# Patient Record
Sex: Male | Born: 1990 | Race: White | Hispanic: No | Marital: Single | Smoking: Current every day smoker
Health system: Southern US, Community
[De-identification: ages and names within clinical notes are randomized; demographics above are authoritative.]

## PROBLEM LIST (undated history)

## (undated) DIAGNOSIS — F1910 Other psychoactive substance abuse, uncomplicated: Secondary | ICD-10-CM

## (undated) DIAGNOSIS — F32A Depression, unspecified: Secondary | ICD-10-CM

## (undated) DIAGNOSIS — F329 Major depressive disorder, single episode, unspecified: Secondary | ICD-10-CM

## (undated) HISTORY — DX: Other psychoactive substance abuse, uncomplicated: F19.10

## (undated) HISTORY — DX: Depression, unspecified: F32.A

## (undated) HISTORY — DX: Major depressive disorder, single episode, unspecified: F32.9

## (undated) HISTORY — PX: OTHER SURGICAL HISTORY: SHX169

---
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
## 2013-04-20 IMAGING — CR DG HIP (WITH OR WITHOUT PELVIS) 2-3V*L*
3 series · 3 of 3 positions shown · non-contrast
Comparison: None.

CLINICAL DATA: Left hip pain.  No known injury.  Previous
dislocation.

LEFT HIP - COMPLETE 2+ VIEW

[t pelvis ap]
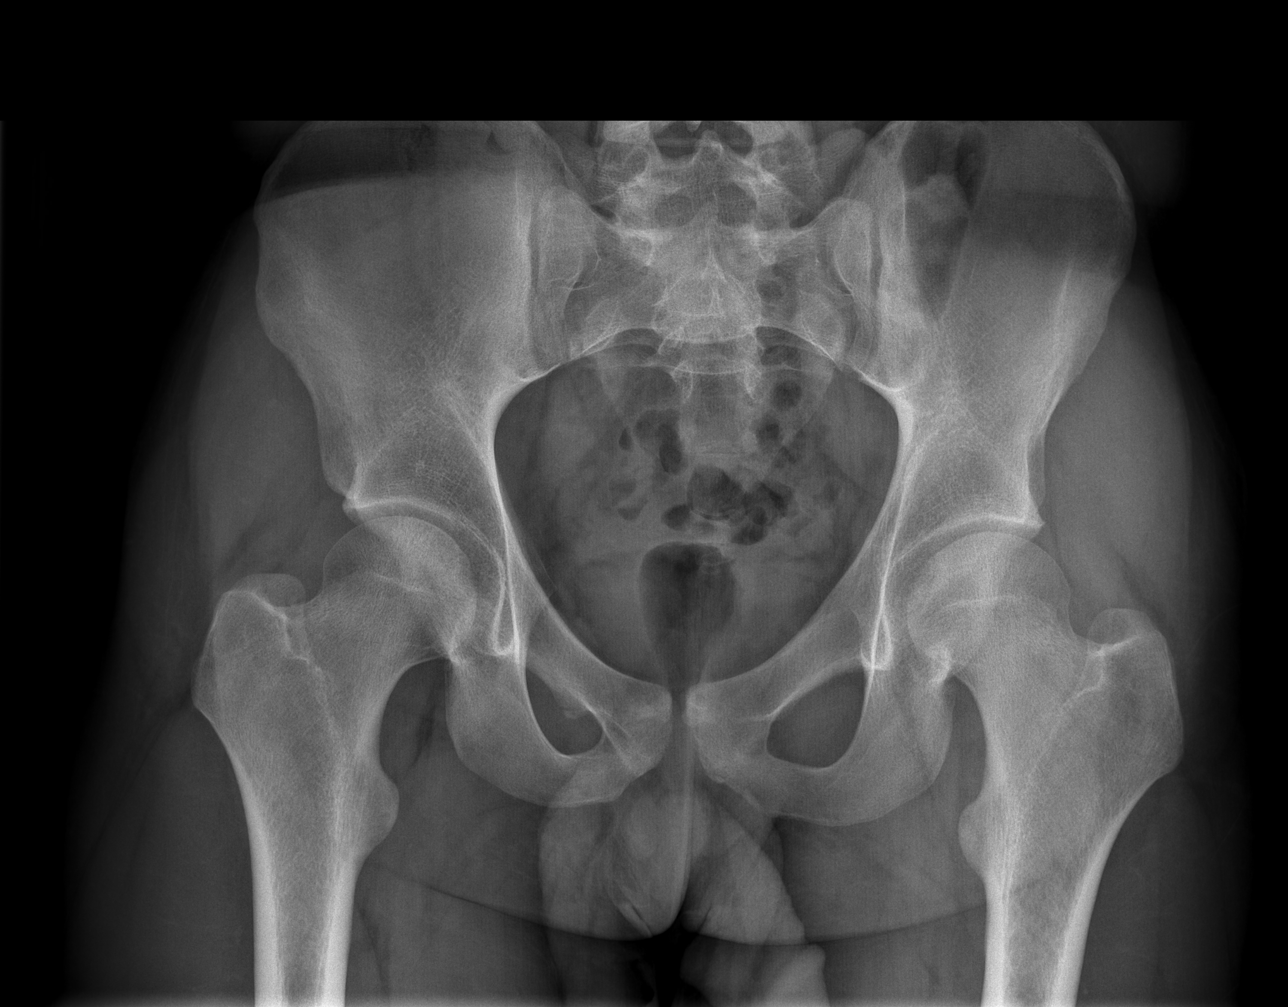

[t hip ap left]
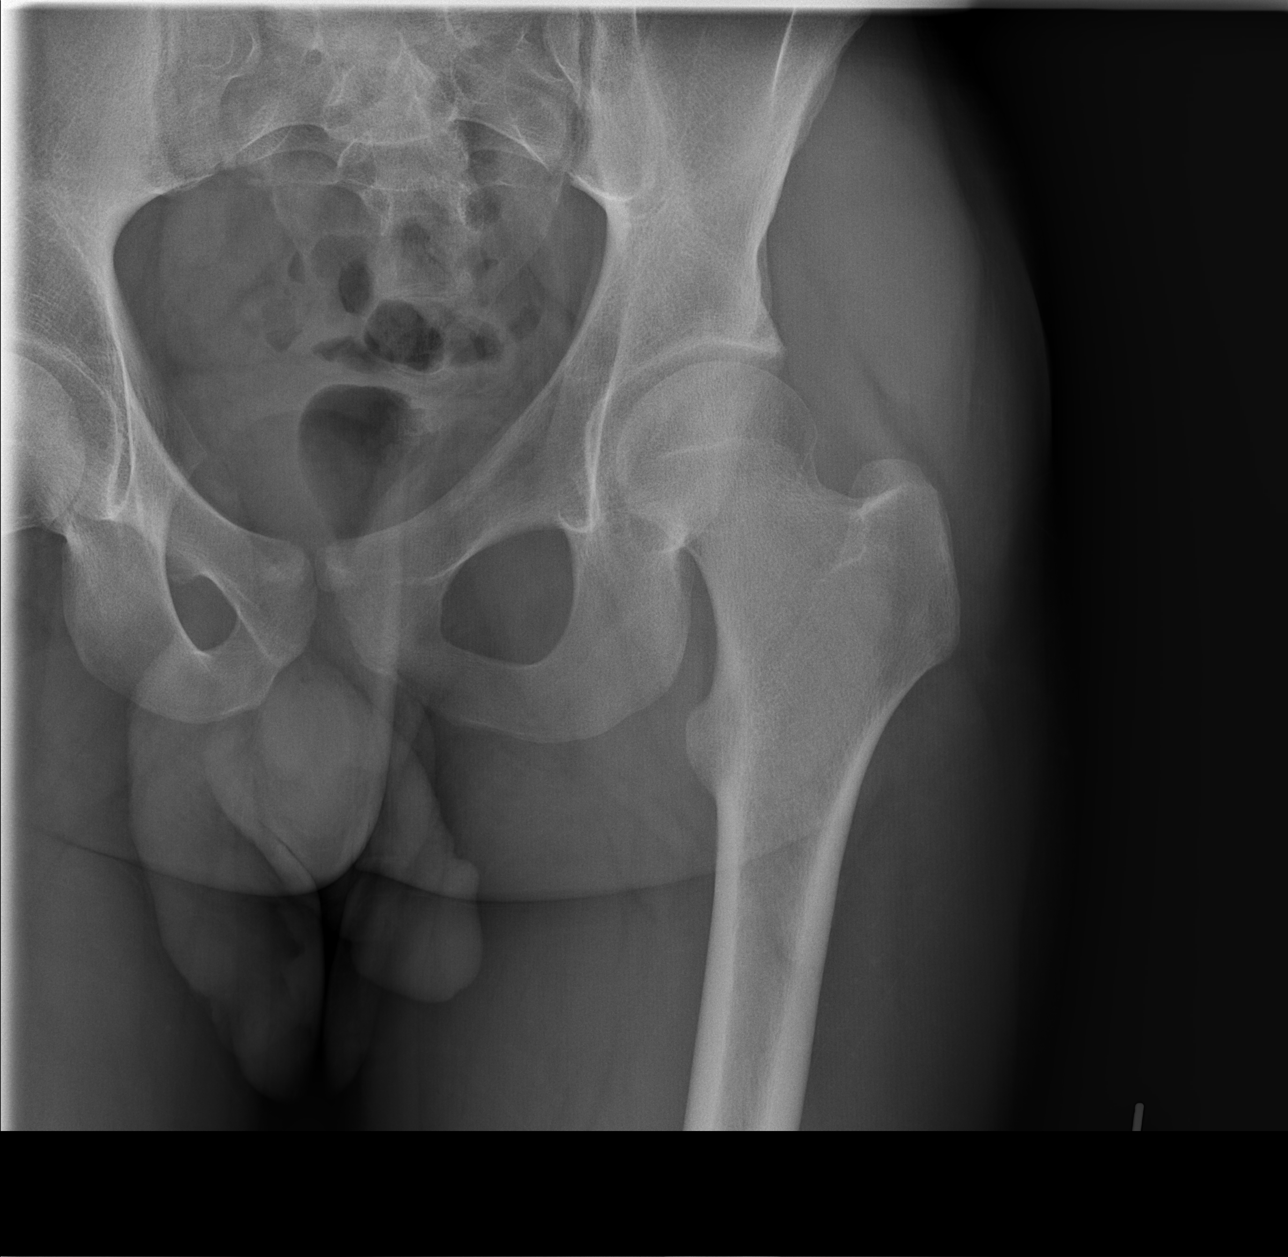

[t hip frog leg left]
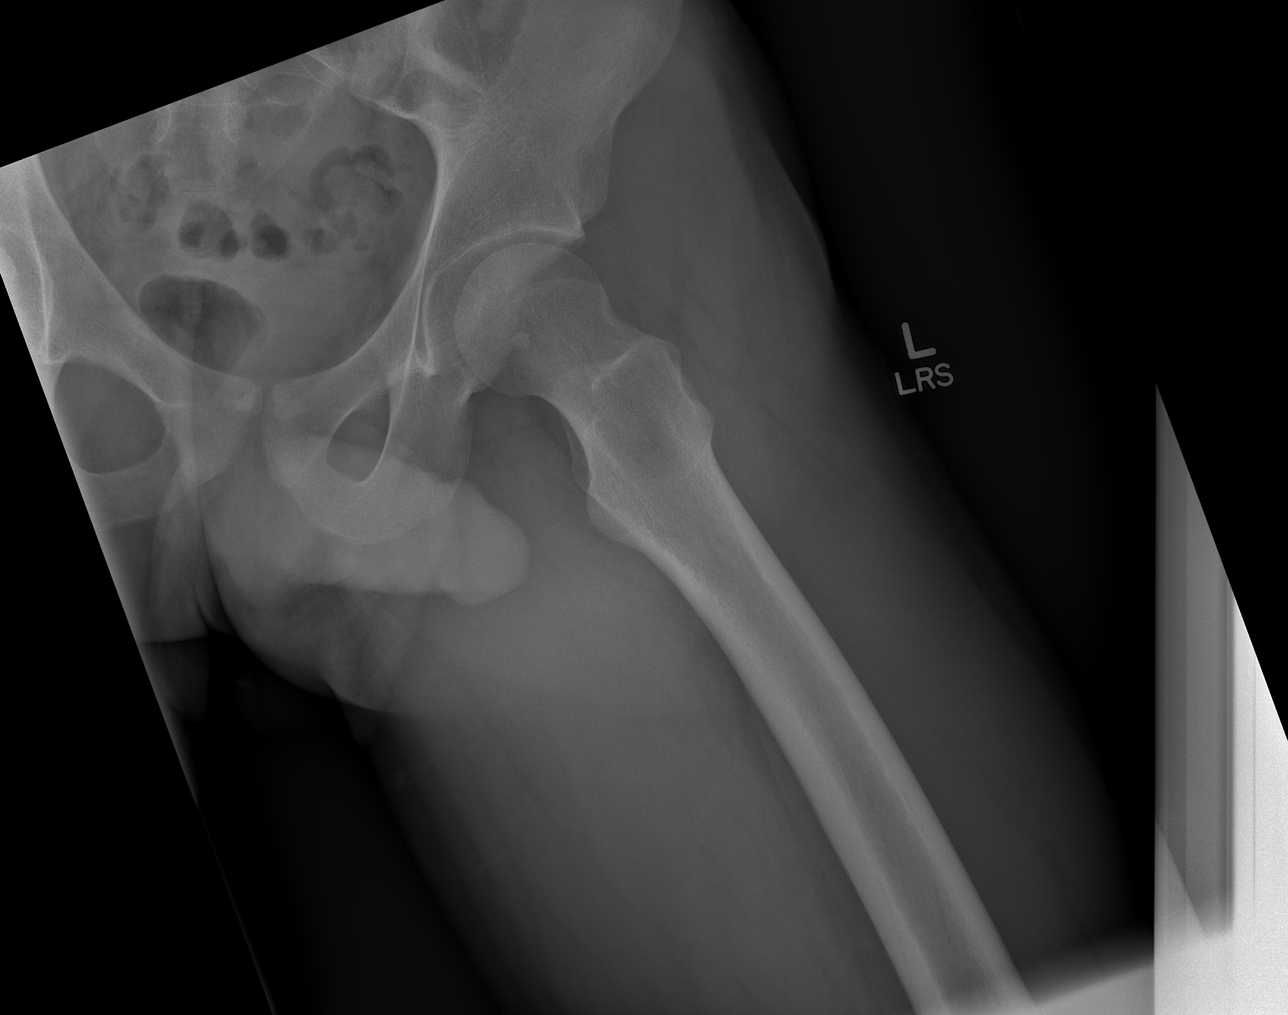

[3 of 3 positions shown; findings below may reference images not displayed]

FINDINGS: The pelvis, SI joints, and symphysis pubis appear intact.
Visualized right hip appears intact.

Left hip demonstrates no evidence of acute fracture or subluxation.
No focal bone lesion or bone destruction.  Bone cortex and
trabecular architecture appear intact.  No abnormal periosteal
reaction.
IMPRESSION: No acute bony abnormalities.

## 2017-05-17 ENCOUNTER — Encounter: Payer: Self-pay | Admitting: *Deleted

## 2017-05-17 ENCOUNTER — Emergency Department
Admission: EM | Admit: 2017-05-17 | Discharge: 2017-05-17 | Disposition: A | Discharge status: Home / Self Care | Payer: Self-pay | Source: Home / Self Care | Attending: Family Medicine | Admitting: Family Medicine

## 2017-05-17 DIAGNOSIS — L237 Allergic contact dermatitis due to plants, except food: Secondary | ICD-10-CM

## 2017-05-17 MED ORDER — METHYLPREDNISOLONE SODIUM SUCC 40 MG IJ SOLR
80.0000 mg | Freq: Once | INTRAMUSCULAR | Status: AC
  Administered 2017-05-17: 80 mg via INTRAMUSCULAR

## 2017-05-17 MED ORDER — HYDROXYZINE HCL 25 MG PO TABS: 25.0000 mg | ORAL_TABLET | Freq: Four times a day (QID) | ORAL | 0 refills | Status: DC | PRN

## 2017-05-17 MED ORDER — PREDNISONE 20 MG PO TABS: ORAL_TABLET | ORAL | 0 refills | Status: DC

## 2017-05-17 NOTE — ED Provider Notes (Signed)
[facility]    CSN: [id number] Arrival date & time: 05/17/17  1757     History   Chief Complaint Chief Complaint  Patient presents with  . Rash    HPI Ernest Ortiz is a 26 y.o. male.   HPI  Ernest Ortiz is a 26 y.o. male presenting to UC with c/o diffuse erythematous pruritic rash that started 2 days ago after he came in contact with likely poison ivy. He has had a similar rash in the past. He notes he is planning to elope in 2 weeks and hopes to get rid of the rash as he does have it on his face as well.  Denies oral swelling or difficulty breathing. No new soaps, lotions or medications. He has tried calamine lotion w/o relief.    Past Medical History:  Diagnosis Date  . Depression   . Drug abuse (HCC)     There are no active problems to display for this patient.   Past Surgical History:  Procedure Laterality Date  . Broken foot    . knee laseration         Home Medications    Prior to Admission medications   Medication Sig Start Date End Date Taking? Authorizing Provider  hydrOXYzine (ATARAX/VISTARIL) 25 MG tablet Take 1 tablet (25 mg total) by mouth every 6 (six) hours as needed. 05/17/17   Lurene Shadow, PA-C  predniSONE (DELTASONE) 20 MG tablet 3 tabs po day one, then 2 po daily x 4 days 05/17/17   Lurene Shadow, PA-C    Family History History reviewed. No pertinent family history.  Social History Social History  Substance Use Topics  . Smoking status: Current Every Day Smoker    Packs/day: 1.00  . Smokeless tobacco: Never Used  . Alcohol use 0.6 oz/week    1 Cans of beer per week     Allergies   Vantin [cefpodoxime proxetil]   Review of Systems Review of Systems  Constitutional: Negative for chills and fever.  HENT: Positive for facial swelling (mild). Negative for sore throat.   Respiratory: Negative for chest tightness, shortness of breath and wheezing.   Cardiovascular: Negative for chest pain and palpitations.    Gastrointestinal: Negative for diarrhea, nausea and vomiting.  Musculoskeletal: Negative for arthralgias, joint swelling and myalgias.  Skin: Positive for color change and rash. Negative for wound.     Physical Exam Triage Vital Signs ED Triage Vitals  Enc Vitals Group     BP 05/17/17 1820 (!) 144/89     Pulse Rate 05/17/17 1820 81     Resp 05/17/17 1820 16     Temp 05/17/17 1820 97.6 F (36.4 C)     Temp Source 05/17/17 1820 Oral     SpO2 05/17/17 1820 99 %     Weight 05/17/17 1821 196 lb (88.9 kg)     Height 05/17/17 1821  (1.778 m)     Head Circumference --      Peak Flow --      Pain Score 05/17/17 1821 0     Pain Loc --      Pain Edu? --      Excl. in GC? --    No data found.   Updated Vital Signs BP (!) 144/89 (BP Location: Left Arm)   Pulse 81   Temp 97.6 F (36.4 C) (Oral)   Resp 16   Ht  (1.778 m)   Wt 196 lb (88.9 kg)   SpO2  99%   BMI 28.12 kg/m   Visual Acuity Right Eye Distance:   Left Eye Distance:   Bilateral Distance:    Right Eye Near:   Left Eye Near:    Bilateral Near:     Physical Exam  Constitutional: He is oriented to person, place, and time. He appears well-developed and well-nourished. No distress.  HENT:  Head: Normocephalic and atraumatic.  Mouth/Throat: Oropharynx is clear and moist.  Eyes: EOM are normal.  Neck: Normal range of motion. Neck supple.  Cardiovascular: Normal rate and regular rhythm.   Pulmonary/Chest: Effort normal and breath sounds normal. No respiratory distress. He has no wheezes. He has no rales.  Musculoskeletal: Normal range of motion.  Neurological: He is alert and oriented to person, place, and time.  Skin: Skin is warm and dry. Rash noted. He is not diaphoretic. There is erythema.  Diffuse erythematous maculopapular rash to face, bilateral arms and abdomen. Rash is non-tender. Does blanch.   Psychiatric: He has a normal mood and affect. His behavior is normal.  Nursing note and vitals  reviewed.    UC Treatments / Results  Labs (all labs ordered are listed, but only abnormal results are displayed) Labs Reviewed - No data to display  EKG  EKG Interpretation None       Radiology No results found.  Procedures Procedures (including critical care time)  Medications Ordered in UC Medications  methylPREDNISolone sodium succinate (SOLU-MEDROL) 40 mg/mL injection 80 mg (80 mg Intramuscular Given 05/17/17 1848)     Initial Impression / Assessment and Plan / UC Course  I have reviewed the triage vital signs and the nursing notes.  Pertinent labs & imaging results that were available during my care of the patient were reviewed by me and considered in my medical decision making (see chart for details).     Rash c/w contact dermatitis.  Solu-medrol  IM given Take medications as prescribed  May take OTC non-drowsy antihistamine such as Claritin or Zyrtec during the day.  F/u with PCP in 1 week if needed.   Final Clinical Impressions(s) / UC Diagnoses   Final diagnoses:  Allergic contact dermatitis due to plants, except food    New Prescriptions Discharge Medication List as of 05/17/2017  6:42 PM    START taking these medications   Details  hydrOXYzine (ATARAX/VISTARIL) 25 MG tablet Take 1 tablet (25 mg total) by mouth every 6 (six) hours as needed., Starting Tue 05/17/2017, Normal    predniSONE (DELTASONE) 20 MG tablet 3 tabs po day one, then 2 po daily x 4 days, Normal         Controlled Substance Prescriptions [place] Controlled Substance Registry consulted? Not Applicable   Rolla Plate 05/20/17 4696

## 2017-05-17 NOTE — Discharge Instructions (Signed)
°  You were given a shot of solumedrol (a steroid) today to help with itching and swelling from a likely allergic reaction.  You have been prescribed 5 days of prednisone, an oral steroid.  You may start this medication tomorrow with breakfast.    Atarax (hydroxizine) is an antihistamine that can be taken to help with itching. This medication can cause drowsiness so do not drive or drink alcohol while taking.

## 2017-05-17 NOTE — ED Triage Notes (Signed)
Pt c/o rash all over x 2 days.

## 2017-11-25 ENCOUNTER — Other Ambulatory Visit: Payer: Self-pay

## 2017-11-25 ENCOUNTER — Emergency Department (INDEPENDENT_AMBULATORY_CARE_PROVIDER_SITE_OTHER)
Admission: EM | Admit: 2017-11-25 | Discharge: 2017-11-25 | Disposition: A | Discharge status: Home / Self Care | Payer: 59 | Source: Home / Self Care | Attending: Family Medicine | Admitting: Family Medicine

## 2017-11-25 DIAGNOSIS — J069 Acute upper respiratory infection, unspecified: Secondary | ICD-10-CM | POA: Diagnosis not present

## 2017-11-25 DIAGNOSIS — B9789 Other viral agents as the cause of diseases classified elsewhere: Secondary | ICD-10-CM

## 2017-11-25 MED ORDER — AZITHROMYCIN 250 MG PO TABS: ORAL_TABLET | ORAL | 0 refills | Status: DC

## 2017-11-25 MED ORDER — BENZONATATE 200 MG PO CAPS: ORAL_CAPSULE | ORAL | 0 refills | Status: DC

## 2017-11-25 NOTE — Discharge Instructions (Addendum)
Take plain guaifenesin (1200mg extended release tabs such as Mucinex) twice daily, with plenty of water, for cough and congestion.  May add Pseudoephedrine (30mg, one or two every 4 to 6 hours) for sinus congestion.  Get adequate rest.   °May use Afrin nasal spray (or generic oxymetazoline) each morning for about 5 days and then discontinue.  Also recommend using saline nasal spray several times daily and saline nasal irrigation (AYR is a common brand).   °Try warm salt water gargles for sore throat.  °Stop all antihistamines for now, and other non-prescription cough/cold preparations. °May take Ibuprofen 200mg, 4 tabs every 8 hours with food for chest/sternum discomfort. °Begin Azithromycin if not improving about one week or if persistent fever develops   °  °

## 2017-11-25 NOTE — ED Triage Notes (Signed)
Pt presents here today with his fiance who both have been sick with productive coughs, with congestion and yellow/green sputum. Only has been taking zyrtec as needed.

## 2017-11-25 NOTE — ED Provider Notes (Signed)
[facility]    CSN: [id number] Arrival date & time: 11/25/17  [id number]     History   Chief Complaint Chief Complaint  Patient presents with  . Cough    HPI Ernest Ortiz is a 27 y.o. male.   Patient complains of three day history of typical cold-like symptoms developing over several days, including mild sore throat, sinus congestion, headache, fatigue, and cough.   The history is provided by the patient.    Past Medical History:  Diagnosis Date  . Depression   . Drug abuse (HCC)     There are no active problems to display for this patient.   Past Surgical History:  Procedure Laterality Date  . Broken foot    . knee laseration         Home Medications    Prior to Admission medications   Medication Sig Start Date End Date Taking? Authorizing Provider  azithromycin (ZITHROMAX Z-PAK) 250 MG tablet Take 2 tabs today; then begin one tab once daily for 4 more days. (Rx void after 12/03/17) 11/25/17   Lattie HawBeese, Biviana Saddler A, MD  benzonatate (TESSALON) 200 MG capsule Take one cap by mouth at bedtime as needed for cough.  May repeat in 4 to 6 hours 11/25/17   Lattie HawBeese, Kydan Shanholtzer A, MD  hydrOXYzine (ATARAX/VISTARIL) 25 MG tablet Take 1 tablet (25 mg total) by mouth every 6 (six) hours as needed. 05/17/17   Lurene ShadowPhelps, Erin O, PA-C    Family History History reviewed. No pertinent family history.  Social History Social History   Tobacco Use  . Smoking status: Current Every Day Smoker    Packs/day: 1.00  . Smokeless tobacco: Never Used  Substance Use Topics  . Alcohol use: Yes    Alcohol/week: 0.6 oz    Types: 1 Cans of beer per week  . Drug use: Yes    Types: Marijuana     Allergies   Vantin [cefpodoxime proxetil]   Review of Systems Review of Systems + sore throat + cough No pleuritic pain No wheezing + nasal congestion + post-nasal drainage No sinus pain/pressure No itchy/red eyes No earache No hemoptysis No SOB No fever, + chills/sweats No  nausea No vomiting No abdominal pain No diarrhea No urinary symptoms No skin rash + fatigue + myalgias No headache Used OTC meds without relief   Physical Exam Triage Vital Signs ED Triage Vitals  Enc Vitals Group     BP 11/25/17 0902 (!) 138/94     Pulse Rate 11/25/17 0902 94     Resp 11/25/17 0902 18     Temp 11/25/17 0902 98.9 F (37.2 C)     Temp Source 11/25/17 0902 Oral     SpO2 11/25/17 0902 98 %     Weight 11/25/17 0903 197 lb (89.4 kg)     Height 11/25/17 0903 5\' 10"  (1.778 m)     Head Circumference --      Peak Flow --      Pain Score 11/25/17 0903 0     Pain Loc --      Pain Edu? --      Excl. in GC? --    No data found.  Updated Vital Signs BP (!) 138/94 (BP Location: Right Arm)   Pulse 94   Temp 98.9 F (37.2 C) (Oral)   Resp 18   Ht 5\' 10"  (1.778 m)   Wt 197 lb (89.4 kg)   SpO2 98%   BMI 28.27 kg/m   Visual Acuity  Right Eye Distance:   Left Eye Distance:   Bilateral Distance:    Right Eye Near:   Left Eye Near:    Bilateral Near:     Physical Exam Nursing notes and Vital Signs reviewed. Appearance:  Patient appears stated age, and in no acute distress Eyes:  Pupils are equal, round, and reactive to light and accomodation.  Extraocular movement is intact.  Conjunctivae are not inflamed  Ears:  Canals normal.  Tympanic membranes normal.  Nose:  Mildly congested turbinates.  No sinus tenderness.  Pharynx:  Normal Neck:  Supple.  Enlarged posterior/lateral nodes are palpated bilaterally, tender to palpation on the left.   Lungs:  Clear to auscultation.  Breath sounds are equal.  Moving air well. Heart:  Regular rate and rhythm without murmurs, rubs, or gallops.  Abdomen:  Nontender without masses or hepatosplenomegaly.  Bowel sounds are present.  No CVA or flank tenderness.  Extremities:  No edema.  Skin:  No rash present.    UC Treatments / Results  Labs (all labs ordered are listed, but only abnormal results are displayed) Labs  Reviewed - No data to display  EKG None Radiology No results found.  Procedures Procedures (including critical care time)  Medications Ordered in UC Medications - No data to display   Initial Impression / Assessment and Plan / UC Course  I have reviewed the triage vital signs and the nursing notes.  Pertinent labs & imaging results that were available during my care of the patient were reviewed by me and considered in my medical decision making (see chart for details).    There is no evidence of bacterial infection today.  Treat symptomatically for now  Prescription written for Benzonatate (Tessalon) to take at bedtime for night-time cough.  Take plain guaifenesin (1200mg  extended release tabs such as Mucinex) twice daily, with plenty of water, for cough and congestion.  May add Pseudoephedrine (30mg , one or two every 4 to 6 hours) for sinus congestion.  Get adequate rest.   May use Afrin nasal spray (or generic oxymetazoline) each morning for about 5 days and then discontinue.  Also recommend using saline nasal spray several times daily and saline nasal irrigation (AYR is a common brand).   Try warm salt water gargles for sore throat.  Stop all antihistamines for now, and other non-prescription cough/cold preparations. May take Ibuprofen 200mg , 4 tabs every 8 hours with food for chest/sternum discomfort. Begin Azithromycin if not improving about one week or if persistent fever develops (Given a prescription to hold, with an expiration date)  Followup with Family Doctor if not improved in about 10 days.    Final Clinical Impressions(s) / UC Diagnoses   Final diagnoses:  Viral URI with cough    ED Discharge Orders        Ordered    benzonatate (TESSALON) 200 MG capsule     11/25/17 0951    azithromycin (ZITHROMAX Z-PAK) 250 MG tablet     11/25/17 4098         Lattie Haw, MD 11/27/17 1655

## 2018-04-18 ENCOUNTER — Other Ambulatory Visit: Payer: Self-pay

## 2018-04-18 ENCOUNTER — Emergency Department (INDEPENDENT_AMBULATORY_CARE_PROVIDER_SITE_OTHER)
Admission: EM | Admit: 2018-04-18 | Discharge: 2018-04-18 | Disposition: A | Discharge status: Home / Self Care | Payer: 59 | Source: Home / Self Care | Attending: Family Medicine | Admitting: Family Medicine

## 2018-04-18 DIAGNOSIS — R05 Cough: Secondary | ICD-10-CM | POA: Diagnosis not present

## 2018-04-18 DIAGNOSIS — B9689 Other specified bacterial agents as the cause of diseases classified elsewhere: Secondary | ICD-10-CM

## 2018-04-18 DIAGNOSIS — R21 Rash and other nonspecific skin eruption: Secondary | ICD-10-CM

## 2018-04-18 DIAGNOSIS — R053 Chronic cough: Secondary | ICD-10-CM

## 2018-04-18 DIAGNOSIS — J019 Acute sinusitis, unspecified: Secondary | ICD-10-CM

## 2018-04-18 MED ORDER — AMOXICILLIN-POT CLAVULANATE 875-125 MG PO TABS: 1.0000 | ORAL_TABLET | Freq: Two times a day (BID) | ORAL | 0 refills | Status: DC

## 2018-04-18 MED ORDER — GUAIFENESIN-CODEINE 100-10 MG/5ML PO SOLN: 5.0000 mL | Freq: Three times a day (TID) | ORAL | 0 refills | Status: DC | PRN

## 2018-04-18 MED ORDER — WITCH HAZEL-GLYCERIN EX PADS: 1.0000 "application " | MEDICATED_PAD | CUTANEOUS | 1 refills | Status: DC | PRN

## 2018-04-18 MED ORDER — ZINC OXIDE 40 % EX OINT: 1.0000 "application " | TOPICAL_OINTMENT | CUTANEOUS | 0 refills | Status: DC | PRN

## 2018-04-18 NOTE — ED Provider Notes (Signed)
[facility]    CSN: [id number] Arrival date & time: 04/18/18  4098     History   Chief Complaint Chief Complaint  Patient presents with  . Rash  . Cough    HPI Roczen Waymire is a 27 y.o. male.   HPI  Henrik Orihuela is a 27 y.o. male presenting to UC with c/o intermittently productive cough for about 1 month, worse over the last 1 week with thick white to green phlegm.  He is SOB at times.  He has tried OTC cough medication w/o relief. No known sick contacts or recent travel. Denies fever, chills, n/v/d. Pt also c/o a painful blistering peeling rash on his penis that started about 3 days ago.  He has not tried any OTC medications. Pt is married in a monogamous relationship. Denies concern for STIs. Denies new soaps, lotions, medications or detergents.     Past Medical History:  Diagnosis Date  . Depression   . Drug abuse (HCC)     There are no active problems to display for this patient.   Past Surgical History:  Procedure Laterality Date  . Broken foot    . knee laseration         Home Medications    Prior to Admission medications   Medication Sig Start Date End Date Taking? Authorizing Provider  amoxicillin-clavulanate (AUGMENTIN) 875-125 MG tablet Take 1 tablet by mouth 2 (two) times daily. One po bid x 7 days 04/18/18   Lurene Shadow, PA-C  azithromycin (ZITHROMAX Z-PAK) 250 MG tablet Take 2 tabs today; then begin one tab once daily for 4 more days. (Rx void after 12/03/17) 11/25/17   Lattie Haw, MD  benzonatate (TESSALON) 200 MG capsule Take one cap by mouth at bedtime as needed for cough.  May repeat in 4 to 6 hours 11/25/17   Lattie Haw, MD  guaiFENesin-codeine 100-10 MG/5ML syrup Take 5-10 mLs by mouth 3 (three) times daily as needed for cough. 04/18/18   Lurene Shadow, PA-C  hydrOXYzine (ATARAX/VISTARIL) 25 MG tablet Take 1 tablet (25 mg total) by mouth every 6 (six) hours as needed. 05/17/17   Lurene Shadow, PA-C  liver oil-zinc  oxide (DESITIN) 40 % ointment Apply 1 application topically as needed for irritation. 04/18/18   Lurene Shadow, PA-C  witch hazel-glycerin (TUCKS) pad Apply 1 application topically as needed for irritation. 04/18/18   Lurene Shadow, PA-C    Family History History reviewed. No pertinent family history.  Social History Social History   Tobacco Use  . Smoking status: Current Every Day Smoker    Packs/day: 1.00  . Smokeless tobacco: Never Used  Substance Use Topics  . Alcohol use: Yes    Alcohol/week: 1.0 standard drinks    Types: 1 Cans of beer per week  . Drug use: Yes    Types: Marijuana     Allergies   Vantin [cefpodoxime proxetil]   Review of Systems Review of Systems  Constitutional: Negative for chills and fever.  HENT: Positive for congestion, postnasal drip and rhinorrhea. Negative for ear pain, sore throat, trouble swallowing and voice change.   Respiratory: Positive for cough. Negative for shortness of breath.   Cardiovascular: Negative for chest pain and palpitations.  Gastrointestinal: Negative for abdominal pain, diarrhea, nausea and vomiting.  Genitourinary: Positive for discharge (from rash), genital sores and penile pain. Negative for dysuria, hematuria, penile swelling, scrotal swelling, testicular pain and urgency.  Musculoskeletal: Negative for arthralgias, back pain  and myalgias.  Skin: Negative for rash.     Physical Exam Triage Vital Signs ED Triage Vitals  Enc Vitals Group     BP 04/18/18 0959 131/88     Pulse Rate 04/18/18 0959 80     Resp --      Temp 04/18/18 0959 98 F (36.7 C)     Temp Source 04/18/18 0959 Oral     SpO2 04/18/18 0959 98 %     Weight 04/18/18 1000 209 lb (94.8 kg)     Height 04/18/18 1000 5\' 10"  (1.778 m)     Head Circumference --      Peak Flow --      Pain Score 04/18/18 1000 3     Pain Loc --      Pain Edu? --      Excl. in GC? --    No data found.  Updated Vital Signs BP 131/88 (BP Location: Right Arm)    Pulse 80   Temp 98 F (36.7 C) (Oral)   Ht 5\' 10"  (1.778 m)   Wt 209 lb (94.8 kg)   SpO2 98%   BMI 29.99 kg/m   Visual Acuity Right Eye Distance:   Left Eye Distance:   Bilateral Distance:    Right Eye Near:   Left Eye Near:    Bilateral Near:     Physical Exam  Constitutional: He is oriented to person, place, and time. He appears well-developed and well-nourished. No distress.  HENT:  Head: Normocephalic and atraumatic.  Right Ear: Tympanic membrane normal.  Left Ear: Tympanic membrane normal.  Nose: Mucosal edema present. Right sinus exhibits maxillary sinus tenderness. Right sinus exhibits no frontal sinus tenderness. Left sinus exhibits maxillary sinus tenderness. Left sinus exhibits no frontal sinus tenderness.  Mouth/Throat: Uvula is midline, oropharynx is clear and moist and mucous membranes are normal.  Eyes: EOM are normal.  Neck: Normal range of motion.  Cardiovascular: Normal rate and regular rhythm.  Pulmonary/Chest: Effort normal and breath sounds normal. No stridor. No respiratory distress. He has no wheezes. He has no rales.  Genitourinary: Testes normal. Circumcised. Penile tenderness present.     Genitourinary Comments: Penile rash: 1cm area of pealed blistered skin. Erythema, scant yellow discharge of rash/open sore.   Musculoskeletal: Normal range of motion.  Neurological: He is alert and oriented to person, place, and time.  Skin: Skin is warm and dry. He is not diaphoretic.  Psychiatric: He has a normal mood and affect. His behavior is normal.  Nursing note and vitals reviewed.    UC Treatments / Results  Labs (all labs ordered are listed, but only abnormal results are displayed) Labs Reviewed - No data to display  EKG None  Radiology No results found.  Procedures Procedures (including critical care time)  Medications Ordered in UC Medications - No data to display  Initial Impression / Assessment and Plan / UC Course  I have reviewed  the triage vital signs and the nursing notes.  Pertinent labs & imaging results that were available during my care of the patient were reviewed by me and considered in my medical decision making (see chart for details).     Hx and exam of URI symptoms c/w sinusitis. Will tx due to duration of symptoms. Offered CXR. Pt would like to try trial of antibiotics first, will f/u with CXR if cough not improving.  Rash on penis appears to be an infected skin sore. No small vesicles visualized c/w herpes.  No penile  discharge from urethra, only from the skin sore.  Pt declined STD testing.   Final Clinical Impressions(s) / UC Diagnoses   Final diagnoses:  Persistent cough for 3 weeks or longer  Acute bacterial rhinosinusitis  Penile rash     Discharge Instructions      Please follow up with a family medicine provider in 1 week if not improving. You may also try to follow up with urology if your rash is not improving.   Guaifenesin-codeine is a narcotic cough medication that can cause drowsiness. Do not drive while taking or take with other medications that could cause drowsiness as taking too much can cause difficulty breathing or even death.      ED Prescriptions    Medication Sig Dispense Auth. Provider   amoxicillin-clavulanate (AUGMENTIN) 875-125 MG tablet  (Status: Discontinued) Take 1 tablet by mouth 2 (two) times daily. One po bid x 7 days 14 tablet Doroteo Glassman, Chaunda Vandergriff O, PA-C   liver oil-zinc oxide (DESITIN) 40 % ointment  (Status: Discontinued) Apply 1 application topically as needed for irritation. 56.7 g Javion Holmer O, PA-C   guaiFENesin-codeine 100-10 MG/5ML syrup  (Status: Discontinued) Take 5-10 mLs by mouth 3 (three) times daily as needed for cough. 120 mL Lurene Shadow, PA-C   witch hazel-glycerin (TUCKS) pad  (Status: Discontinued) Apply 1 application topically as needed for irritation. 40 each Lurene Shadow, PA-C   amoxicillin-clavulanate (AUGMENTIN) 875-125 MG tablet Take 1  tablet by mouth 2 (two) times daily. One po bid x 7 days 14 tablet Bobi Daudelin O, PA-C   guaiFENesin-codeine 100-10 MG/5ML syrup Take 5-10 mLs by mouth 3 (three) times daily as needed for cough. 120 mL Waylan Rocher O, PA-C   liver oil-zinc oxide (DESITIN) 40 % ointment Apply 1 application topically as needed for irritation. 56.7 g Lurene Shadow, PA-C   witch hazel-glycerin (TUCKS) pad Apply 1 application topically as needed for irritation. 40 each Lurene Shadow, PA-C     Controlled Substance Prescriptions [place] Controlled Substance Registry consulted? Not Applicable   Rolla Plate 04/18/18 1324

## 2018-04-18 NOTE — Discharge Instructions (Addendum)
°  Please follow up with a family medicine provider in 1 week if not improving. You may also try to follow up with urology if your rash is not improving.   Guaifenesin-codeine is a narcotic cough medication that can cause drowsiness. Do not drive while taking or take with other medications that could cause drowsiness as taking too much can cause difficulty breathing or even death.

## 2018-04-18 NOTE — ED Triage Notes (Signed)
Pt has had a cough for a month, last week it became worse, feels SOB at times. Also has had a rash on penis x 3 days.  No concern for STD

## 2023-07-12 ENCOUNTER — Other Ambulatory Visit: Payer: Self-pay

## 2023-07-12 ENCOUNTER — Ambulatory Visit
Admission: EM | Admit: 2023-07-12 | Discharge: 2023-07-12 | Disposition: A | Discharge status: Home / Self Care | Payer: No Typology Code available for payment source | Attending: Family Medicine | Admitting: Family Medicine

## 2023-07-12 DIAGNOSIS — L739 Follicular disorder, unspecified: Secondary | ICD-10-CM

## 2023-07-12 DIAGNOSIS — R21 Rash and other nonspecific skin eruption: Secondary | ICD-10-CM

## 2023-07-12 DIAGNOSIS — N489 Disorder of penis, unspecified: Secondary | ICD-10-CM

## 2023-07-12 MED ORDER — DOXYCYCLINE HYCLATE 100 MG PO CAPS: 100.0000 mg | ORAL_CAPSULE | Freq: Two times a day (BID) | ORAL | 0 refills | Status: AC

## 2023-07-12 MED ORDER — PREDNISONE 10 MG (21) PO TBPK: ORAL_TABLET | Freq: Every day | ORAL | 0 refills | Status: AC

## 2023-07-12 MED ORDER — METHYLPREDNISOLONE SODIUM SUCC 125 MG IJ SOLR
125.0000 mg | Freq: Once | INTRAMUSCULAR | Status: AC
  Administered 2023-07-12: 125 mg via INTRAMUSCULAR

## 2023-07-12 NOTE — ED Provider Notes (Signed)
[facility]    CSN: [id number] Arrival date & time: 07/12/23  1433      History   Chief Complaint Chief Complaint  Patient presents with   Rash    HPI Ernest Ortiz is a 32 y.o. male.   HPI 32 year old male presents with a rash for 3 weeks.  Patient reports rash began under axilla and now has spread to arm, abdomen, and back, also reports different appearing rash of groin.  Additionally, patient reports painful bump on penis.  PMH significant for depression and drug abuse.  Past Medical History:  Diagnosis Date   Depression    Drug abuse (HCC)     There are no problems to display for this patient.   Past Surgical History:  Procedure Laterality Date   Broken foot     knee laseration         Home Medications    Prior to Admission medications   Medication Sig Start Date End Date Taking? Authorizing Provider  doxycycline (VIBRAMYCIN) 100 MG capsule Take 1 capsule (100 mg total) by mouth 2 (two) times daily for 10 days. 07/12/23 07/22/23 Yes Trevor Iha, FNP  predniSONE (STERAPRED UNI-PAK 21 TAB) 10 MG (21) TBPK tablet Take by mouth daily. Take 6 tabs by mouth daily  for 2 days, then 5 tabs for 2 days, then 4 tabs for 2 days, then 3 tabs for 2 days, 2 tabs for 2 days, then 1 tab by mouth daily for 2 days 07/12/23  Yes Trevor Iha, FNP    Family History History reviewed. No pertinent family history.  Social History Social History   Tobacco Use   Smoking status: Every Day    Current packs/day: 1.00    Types: Cigarettes   Smokeless tobacco: Never  Vaping Use   Vaping status: Never Used  Substance Use Topics   Alcohol use: Yes    Alcohol/week: 1.0 standard drink of alcohol    Types: 1 Cans of beer per week   Drug use: Yes    Types: Marijuana     Allergies   Vantin [cefpodoxime proxetil]   Review of Systems Review of Systems   Physical Exam Triage Vital Signs ED Triage Vitals  Encounter Vitals Group     BP 07/12/23 1448 (!)  154/98     Systolic BP Percentile --      Diastolic BP Percentile --      Pulse Rate 07/12/23 1448 (!) 112     Resp 07/12/23 1448 17     Temp 07/12/23 1448 98.2 F (36.8 C)     Temp Source 07/12/23 1448 Oral     SpO2 07/12/23 1448 98 %     Weight --      Height --      Head Circumference --      Peak Flow --      Pain Score 07/12/23 1450 0     Pain Loc --      Pain Education --      Exclude from Growth Chart --    No data found.  Updated Vital Signs BP (!) 154/98 (BP Location: Right Arm)   Pulse (!) 112   Temp 98.2 F (36.8 C) (Oral)   Resp 17   SpO2 98%    Physical Exam Vitals and nursing note reviewed.  Constitutional:      Appearance: Normal appearance. He is normal weight.  HENT:     Head: Normocephalic and atraumatic.     Mouth/Throat:  Mouth: Mucous membranes are moist.     Pharynx: Oropharynx is clear.  Eyes:     Extraocular Movements: Extraocular movements intact.     Conjunctiva/sclera: Conjunctivae normal.     Pupils: Pupils are equal, round, and reactive to light.  Cardiovascular:     Rate and Rhythm: Normal rate and regular rhythm.     Pulses: Normal pulses.     Heart sounds: Normal heart sounds.  Pulmonary:     Effort: Pulmonary effort is normal.     Breath sounds: Normal breath sounds. No wheezing, rhonchi or rales.  Genitourinary:    Penis: Normal.      Testes: Normal.     Comments: Shaft of penis (anterior/posterior aspects):~0.5 cm x 0.5 cm crusted, erythematous lesion-HSV culture and typing sample taken for testing per patient request Musculoskeletal:        General: Normal range of motion.     Cervical back: Normal range of motion and neck supple.  Skin:    General: Skin is warm and dry.     Comments: Abdomen/bilateral axilla: Erythematous, pruritic, maculopapular eruption with distinguish satellite papules-please see image below  Symphysis pubis area (central inferior aspects): Multiple erythematous mildly pulsatile lesions noted,  patient reports shaving this area roughly 1 to 2 weeks ago   Neurological:     General: No focal deficit present.     Mental Status: He is alert and oriented to person, place, and time. Mental status is at baseline.  Psychiatric:        Mood and Affect: Mood normal.        Behavior: Behavior normal.        Thought Content: Thought content normal.         UC Treatments / Results  Labs (all labs ordered are listed, but only abnormal results are displayed) Labs Reviewed  HSV CULTURE AND TYPING    EKG   Radiology No results found.  Procedures Procedures (including critical care time)  Medications Ordered in UC Medications  methylPREDNISolone sodium succinate (SOLU-MEDROL) 125 mg/2 mL injection 125 mg (125 mg Intramuscular Given 07/12/23 1556)    Initial Impression / Assessment and Plan / UC Course  I have reviewed the triage vital signs and the nursing notes.  Pertinent labs & imaging results that were available during my care of the patient were reviewed by me and considered in my medical decision making (see chart for details).     MDM: 1.  Rash and nonspecific skin eruption-IM Solu-Medrol 125 mg given once in clinic, Rx'd Sterapred Unipak (tapering from 60 mg to 10 mg over 10 days); 2.  Folliculitis-Rx'd doxycycline; 3.  Penile lesion-HSV culture and typing taken. Advised patient to take medications as directed with food to completion.  Encouraged to increase daily water intake to 64 ounces per day while taking these medications.  Advised patient to change bed linens for the next 3 days to avoid recontamination from rash.  Advised we will follow-up with lab results once received.  Advised if symptoms worsen and/or unresolved please follow-up PCP or here.  Encourage patient to establish with [place] primary care provider contact information provided with this AVS today.  Patient discharged home, hemodynamically stable. Final Clinical Impressions(s) / UC Diagnoses    Final diagnoses:  Rash and nonspecific skin eruption  Folliculitis  Penile lesion     Discharge Instructions      Advised patient to take medications as directed with food to completion.  Encouraged to increase daily water intake  to 64 ounces per day while taking these medications.  Advised patient to change bed linens for the next 3 days to avoid recontamination from rash.  Advised we will follow-up with lab results once received.  Advised if symptoms worsen and/or unresolved please follow-up PCP or here.  Encourage patient to establish with [place] primary care provider contact information provided with this AVS today.     ED Prescriptions     Medication Sig Dispense Auth. Provider   doxycycline (VIBRAMYCIN) 100 MG capsule Take 1 capsule (100 mg total) by mouth 2 (two) times daily for 10 days. 20 capsule Trevor Iha, FNP   predniSONE (STERAPRED UNI-PAK 21 TAB) 10 MG (21) TBPK tablet Take by mouth daily. Take 6 tabs by mouth daily  for 2 days, then 5 tabs for 2 days, then 4 tabs for 2 days, then 3 tabs for 2 days, 2 tabs for 2 days, then 1 tab by mouth daily for 2 days 42 tablet Trevor Iha, FNP      PDMP not reviewed this encounter.   Trevor Iha, FNP 07/12/23 [phone]

## 2023-07-12 NOTE — ED Triage Notes (Signed)
Pt c/o rash x 3 weeks. Started in underarm area first then progressed to other arm, abd and back. Says he also has a rash in groin area that's a little different appearing. Denies pain or itching. He's esp concerned about one bump at the base of his penis which is painful.

## 2023-07-12 NOTE — Discharge Instructions (Addendum)
Advised patient to take medications as directed with food to completion.  Encouraged to increase daily water intake to 64 ounces per day while taking these medications.  Advised patient to change bed linens for the next 3 days to avoid recontamination from rash.  Advised we will follow-up with lab results once received.  Advised if symptoms worsen and/or unresolved please follow-up PCP or here.  Encourage patient to establish with [place] primary care provider contact information provided with this AVS today.

## 2023-07-13 ENCOUNTER — Telehealth: Payer: Self-pay

## 2023-07-13 NOTE — Telephone Encounter (Addendum)
Received call from lab, notified the swab received for hsv had an error and cannot be ran. On duty provider notified, states to call patient and notify. This was performed, patient aware he can come back in for recollect.
